# Patient Record
Sex: Male | Born: 2000 | Race: White | Hispanic: No | Marital: Single
Health system: Southern US, Community
[De-identification: ages and names within clinical notes are randomized; demographics above are authoritative.]

---
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
## 2022-01-19 ENCOUNTER — Emergency Department (HOSPITAL_COMMUNITY)
Admission: EM | Admit: 2022-01-19 | Discharge: 2022-01-19 | Disposition: A | Discharge status: Home / Self Care | Payer: 59 | Attending: Emergency Medicine | Admitting: Emergency Medicine

## 2022-01-19 ENCOUNTER — Emergency Department (HOSPITAL_COMMUNITY): Payer: 59

## 2022-01-19 ENCOUNTER — Encounter (HOSPITAL_COMMUNITY): Payer: Self-pay

## 2022-01-19 DIAGNOSIS — R519 Headache, unspecified: Secondary | ICD-10-CM

## 2022-01-19 DIAGNOSIS — M542 Cervicalgia: Secondary | ICD-10-CM | POA: Diagnosis not present

## 2022-01-19 LAB — BASIC METABOLIC PANEL
Anion gap: 11 (ref 5–15)
BUN: 21 mg/dL — ABNORMAL HIGH (ref 6–20)
CO2: 24 mmol/L (ref 22–32)
Calcium: 9.9 mg/dL (ref 8.9–10.3)
Chloride: 104 mmol/L (ref 98–111)
Creatinine, Ser: 0.88 mg/dL (ref 0.61–1.24)
GFR, Estimated: >60 mL/min (ref >60)
Glucose, Bld: 102 mg/dL — ABNORMAL HIGH (ref 70–99)
Potassium: 3.9 mmol/L (ref 3.5–5.1)
Sodium: 139 mmol/L (ref 135–145)

## 2022-01-19 LAB — CBC
HCT: 44.9 % (ref 39.0–52.0)
Hemoglobin: 15.3 g/dL (ref 13.0–17.0)
MCH: 30.7 pg (ref 26.0–34.0)
MCHC: 34.1 g/dL (ref 30.0–36.0)
MCV: 90.2 fL (ref 80.0–100.0)
Platelets: 319 10*3/uL (ref 150–400)
RBC: 4.98 MIL/uL (ref 4.22–5.81)
RDW: 11.3 % — ABNORMAL LOW (ref 11.5–15.5)
WBC: 6.8 10*3/uL (ref 4.0–10.5)
nRBC: 0 % (ref 0.0–0.2)

## 2022-01-19 MED ORDER — KETOROLAC TROMETHAMINE 30 MG/ML IJ SOLN
30.0000 mg | Freq: Once | INTRAMUSCULAR | Status: AC
  Administered 2022-01-19: 30 mg via INTRAVENOUS
  Filled 2022-01-19: qty 1

## 2022-01-19 MED ORDER — METHOCARBAMOL 500 MG PO TABS: 500.0000 mg | ORAL_TABLET | Freq: Two times a day (BID) | ORAL | 0 refills | Status: AC | PRN

## 2022-01-19 MED ORDER — IBUPROFEN 800 MG PO TABS: 800.0000 mg | ORAL_TABLET | Freq: Three times a day (TID) | ORAL | 0 refills | Status: AC | PRN

## 2022-01-19 MED ORDER — DIPHENHYDRAMINE HCL 50 MG/ML IJ SOLN
12.5000 mg | Freq: Once | INTRAMUSCULAR | Status: AC
  Administered 2022-01-19: 12.5 mg via INTRAVENOUS
  Filled 2022-01-19: qty 1

## 2022-01-19 MED ORDER — METOCLOPRAMIDE HCL 5 MG/ML IJ SOLN
10.0000 mg | Freq: Once | INTRAMUSCULAR | Status: AC
  Administered 2022-01-19: 10 mg via INTRAVENOUS
  Filled 2022-01-19: qty 2

## 2022-01-19 NOTE — Discharge Instructions (Signed)
You were seen in the emergency department for left-sided head and neck pain.  You had blood work and a CAT scan that did not show an obvious explanation for your pain.  We are trialing you on some anti-inflammatory and muscle relaxant.  Please drink plenty of fluids and you can try ice or heat to the affected area.  Follow-up with your regular doctor.  Return to the emergency department if any worsening or concerning symptoms.

## 2022-01-19 NOTE — ED Provider Notes (Signed)
[facility] EMERGENCY DEPARTMENT Provider Note   CSN: [id number] Arrival date & time: 01/19/22  0630     History  Chief Complaint  Patient presents with   Headache    Curtis Bradley is a 21 y.o. male.  He has no significant past medical history.  He said he woke up at 4 AM when he rolled over to lay back down to go to sleep he had pain in the left parietal scalp area.  It is worse with movement of his head.  He does not have any pain if he leans his head forward.  No blurry vision double vision numbness weakness nausea vomiting.  No recent trauma.  Does not normally get headaches.  Has tried nothing for it.  The history is provided by the patient.  Headache Pain location:  L parietal Severity currently:  6/10 Severity at highest:  6/10 Onset quality:  Sudden Duration:  6 hours Timing:  Intermittent Progression:  Unchanged Chronicity:  New Relieved by: Head position. Ineffective treatments:  None tried Associated symptoms: no abdominal pain, no blurred vision, no cough, no diarrhea, no ear pain, no eye pain, no facial pain, no fever, no nausea, no numbness, no sore throat, no visual change, no vomiting and no weakness        Home Medications Prior to Admission medications   Not on File      Allergies    Patient has no known allergies.    Review of Systems   Review of Systems  Constitutional:  Negative for fever.  HENT:  Negative for ear pain and sore throat.   Eyes:  Negative for blurred vision and pain.  Respiratory:  Negative for cough and shortness of breath.   Cardiovascular:  Negative for chest pain.  Gastrointestinal:  Negative for abdominal pain, diarrhea, nausea and vomiting.  Genitourinary:  Negative for dysuria.  Skin:  Negative for rash.  Neurological:  Positive for headaches. Negative for weakness and numbness.    Physical Exam Updated Vital Signs BP 109/67   Pulse 76   Temp 98 F (36.7 C)   Resp 18   SpO2 99%  Physical  Exam Vitals and nursing note reviewed.  Constitutional:      General: He is not in acute distress.    Appearance: He is well-developed.  HENT:     Head: Normocephalic and atraumatic.  Eyes:     Conjunctiva/sclera: Conjunctivae normal.  Cardiovascular:     Rate and Rhythm: Normal rate and regular rhythm.     Heart sounds: No murmur heard. Pulmonary:     Effort: Pulmonary effort is normal. No respiratory distress.     Breath sounds: Normal breath sounds.  Abdominal:     Palpations: Abdomen is soft.     Tenderness: There is no abdominal tenderness.  Musculoskeletal:        General: No swelling.     Cervical back: Neck supple.  Skin:    General: Skin is warm and dry.     Capillary Refill: Capillary refill takes less than 2 seconds.  Neurological:     Mental Status: He is alert.     GCS: GCS eye subscore is 4. GCS verbal subscore is 5. GCS motor subscore is 6.     Cranial Nerves: No cranial nerve deficit, dysarthria or facial asymmetry.     Sensory: No sensory deficit.     Motor: No weakness.     ED Results / Procedures / Treatments   Labs (  all labs ordered are listed, but only abnormal results are displayed) Labs Reviewed  CBC - Abnormal; Notable for the following components:      Result Value   RDW 11.3 (*)    All other components within normal limits  BASIC METABOLIC PANEL - Abnormal; Notable for the following components:   Glucose, Bld 102 (*)    BUN 21 (*)    All other components within normal limits    EKG None  Radiology CT Head Wo Contrast  Result Date: 01/19/2022 CLINICAL DATA:  Sudden severe headache. EXAM: CT HEAD WITHOUT CONTRAST TECHNIQUE: Contiguous axial images were obtained from the base of the skull through the vertex without intravenous contrast. RADIATION DOSE REDUCTION: This exam was performed according to the departmental dose-optimization program which includes automated exposure control, adjustment of the mA and/or kV according to patient size  and/or use of iterative reconstruction technique. COMPARISON:  None Available. FINDINGS: Brain: Ventricles and sulci are appropriate for patient's age. No evidence for acute cortically based infarct, intracranial hemorrhage, mass lesion or mass-effect. Vascular: No hyperdense vessel or unexpected calcification. Skull: Intact. Sinuses/Orbits: Paranasal sinuses are well aerated. Mastoid air cells are unremarkable. Other: None. IMPRESSION: No acute intracranial process. Electronically Signed   By: Annia Belt M.D.   On: 01/19/2022 09:31    Procedures Procedures    Medications Ordered in ED Medications  metoCLOPramide (REGLAN) injection 10 mg (has no administration in time range)  ketorolac (TORADOL) 30 MG/ML injection 30 mg (has no administration in time range)  diphenhydrAMINE (BENADRYL) injection 12.5 mg (has no administration in time range)    ED Course/ Medical Decision Making/ A&P Clinical Course as of 01/19/22 1855  Fri Jan 19, 2022  1211 Patient states his pain is better.  He only has pain now with specific movements of his head.  Work-up has been fairly unremarkable.  Do not feel he needs a lumbar puncture at this time clear mental status and seems much more mechanical.  Will trial on NSAIDs and muscle relaxant.  Return instructions reviewed with patient and parents. [MB]    Clinical Course User Index [MB] Terrilee Files, MD                           Medical Decision Making Amount and/or Complexity of Data Reviewed Labs: ordered. Radiology: ordered.  Risk Prescription drug management.  This patient complains of left-sided hip pain; this involves an extensive number of treatment Options and is a complaint that carries with it a high risk of complications and morbidity. The differential includes headache, migraine, muscle spasm, subarachnoid, infection  I ordered, reviewed and interpreted labs, which included CBC normal, chemistries unremarkable I ordered medication IV  migraine cocktail with improvement in his symptoms and reviewed PMP when indicated. I ordered imaging studies which included head CT and I independently    visualized and interpreted imaging which showed no acute findings Additional history obtained from patient's parents Previous records obtained and reviewed in epic no recent admissions  Cardiac monitoring reviewed, normal sinus rhythm Social determinants considered, no significant barriers Critical Interventions: None  After the interventions stated above, I reevaluated the patient and found patient to be symptomatically improved Admission and further testing considered, do not feel he needs a lumbar puncture at this time.  Will treat with muscle relaxant as seems more muscular.  Recommended follow-up with PCP and return instructions discussed.          Final Clinical  Impression(s) / ED Diagnoses Final diagnoses:  Left-sided headache  Neck pain on left side    Rx / DC Orders ED Discharge Orders          Ordered    ibuprofen (ADVIL) 800 MG tablet  Every 8 hours PRN        01/19/22 1214    methocarbamol (ROBAXIN) 500 MG tablet  2 times daily PRN        01/19/22 1214              Terrilee Files, MD 01/19/22 1857

## 2022-01-19 NOTE — ED Triage Notes (Signed)
Pt states that he woke up tonight with a headache in the back of his head, pain radiates to neck, denies nausea or photophobia

## 2022-01-19 NOTE — ED Notes (Signed)
Patient verbalizes understanding of discharge instructions. Opportunity for questioning and answers were provided. Pt discharged from ED.
